# Patient Record
Sex: Female | Born: 1995 | Race: Black or African American | Hispanic: No | Marital: Single | Smoking: Never smoker
Health system: Southern US, Community
[De-identification: ages and names within clinical notes are randomized; demographics above are authoritative.]

## PROBLEM LIST (undated history)

## (undated) DIAGNOSIS — T7840XA Allergy, unspecified, initial encounter: Secondary | ICD-10-CM

## (undated) HISTORY — DX: Allergy, unspecified, initial encounter: T78.40XA

---
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
## 2015-01-20 ENCOUNTER — Telehealth: Payer: Self-pay | Admitting: Family Medicine

## 2015-01-21 NOTE — Telephone Encounter (Signed)
error 

## 2016-01-11 ENCOUNTER — Emergency Department (HOSPITAL_COMMUNITY)
Admission: EM | Admit: 2016-01-11 | Discharge: 2016-01-11 | Disposition: A | Discharge status: Home / Self Care | Payer: Medicaid Other | Attending: Emergency Medicine | Admitting: Emergency Medicine

## 2016-01-11 ENCOUNTER — Encounter (HOSPITAL_COMMUNITY): Payer: Self-pay | Admitting: Neurology

## 2016-01-11 DIAGNOSIS — R519 Headache, unspecified: Secondary | ICD-10-CM

## 2016-01-11 DIAGNOSIS — R51 Headache: Secondary | ICD-10-CM | POA: Insufficient documentation

## 2016-01-11 LAB — POC URINE PREG, ED: Preg Test, Ur: NEGATIVE

## 2016-01-11 MED ORDER — NAPROXEN 500 MG PO TABS: 500.0000 mg | ORAL_TABLET | Freq: Two times a day (BID) | ORAL | 0 refills | Status: AC

## 2016-01-11 NOTE — ED Notes (Signed)
Patient ambulated from waiting room to room. Pt tolerated well.

## 2016-01-11 NOTE — ED Provider Notes (Signed)
[facility]-EMERGENCY DEPT Provider Note   CSN: [id number] Arrival date & time: 01/11/16  1040     History   Chief Complaint Chief Complaint  Patient presents with  . Migraine    HPI Rebekah Murphy is a 20 y.o. female.  The history is provided by the patient.  Migraine  This is a recurrent problem. Episode onset: 3 days ago. The problem occurs every several days. The problem has been gradually worsening. Associated symptoms include headaches. Pertinent negatives include no chest pain and no abdominal pain. Nothing aggravates the symptoms. Nothing relieves the symptoms. Treatments tried: ibuprofen. The treatment provided no relief.    Past Medical History:  Diagnosis Date  . Allergy     There are no active problems to display for this patient.   History reviewed. No pertinent surgical history.  OB History    No data available       Home Medications    Prior to Admission medications   Medication Sig Start Date End Date Taking? Authorizing Provider  diphenhydrAMINE (BENADRYL) 25 MG tablet Take 25 mg by mouth every 6 (six) hours as needed.    Historical Provider, MD    Family History No family history on file.  Social History Social History  Substance Use Topics  . Smoking status: Never Smoker  . Smokeless tobacco: Not on file  . Alcohol use No     Allergies   Review of patient's allergies indicates no known allergies.   Review of Systems Review of Systems  Cardiovascular: Negative for chest pain.  Gastrointestinal: Negative for abdominal pain.  Neurological: Positive for headaches.  All other systems reviewed and are negative.    Physical Exam Updated Vital Signs BP 108/82 (BP Location: Left Arm)   Pulse 77   Temp 98 F (36.7 C) (Oral)   Resp 18   LMP 01/04/2016   SpO2 96%   Physical Exam  Constitutional: She is oriented to person, place, and time. She appears well-developed and well-nourished. No distress.  HENT:  Head: Normocephalic.  Eyes:  Conjunctivae are normal.  Neck: Neck supple. No tracheal deviation present.  Cardiovascular: Normal rate and regular rhythm.   Pulmonary/Chest: Effort normal. No respiratory distress.  Abdominal: Soft. She exhibits no distension.  Neurological: She is alert and oriented to person, place, and time. She has normal strength. No cranial nerve deficit. Coordination and gait normal. GCS eye subscore is 4. GCS verbal subscore is 5. GCS motor subscore is 6.  Normal finger to nose testing and rapid alternating movement   Skin: Skin is warm and dry.  Psychiatric: She has a normal mood and affect.     ED Treatments / Results  Labs (all labs ordered are listed, but only abnormal results are displayed) Labs Reviewed  POC URINE PREG, ED    EKG  EKG Interpretation None       Radiology No results found.  Procedures Procedures (including critical care time)  Medications Ordered in ED Medications - No data to display   Initial Impression / Assessment and Plan / ED Course  I have reviewed the triage vital signs and the nursing notes.  Pertinent labs & imaging results that were available during my care of the patient were reviewed by me and considered in my medical decision making (see chart for details).  Clinical Course    20 y.o. female presents with migraine headache starting 3 days ago. Not responding to supportive care measures at home. No neurologic deficits here and otherwise well appearing  despite discomfort. No red flag symptoms for headache, no signs or symptoms of spontaneous ICH. Declined IV therapy for symptoms.discussed optimal dosing of NSAIDs to help treat and prevent recurrence. Plan to follow up with PCP as needed and return precautions discussed for worsening or new concerning symptoms.   Final Clinical Impressions(s) / ED Diagnoses   Final diagnoses:  Acute nonintractable headache, unspecified headache type    New Prescriptions Discharge Medication List as of  01/11/2016  2:19 PM    START taking these medications   Details  naproxen (NAPROSYN) 500 MG tablet Take 1 tablet (500 mg total) by mouth 2 (two) times daily with a meal., Starting Wed 01/11/2016, Until Mon 01/16/2016, Print         Lyndal Pulleyaniel Vara Mairena, MD 01/11/16 1745

## 2016-01-11 NOTE — ED Triage Notes (Signed)
Pt here for h/a x 3 days. Denies vision problems, has photophobia. Is a x 4. In NAD. Has been having h/a for several months.

## 2018-06-11 ENCOUNTER — Encounter (HOSPITAL_BASED_OUTPATIENT_CLINIC_OR_DEPARTMENT_OTHER): Payer: Self-pay

## 2018-06-11 ENCOUNTER — Emergency Department (HOSPITAL_BASED_OUTPATIENT_CLINIC_OR_DEPARTMENT_OTHER)
Admission: EM | Admit: 2018-06-11 | Discharge: 2018-06-11 | Disposition: A | Discharge status: Home / Self Care | Payer: Self-pay | Attending: Emergency Medicine | Admitting: Emergency Medicine

## 2018-06-11 ENCOUNTER — Other Ambulatory Visit: Payer: Self-pay

## 2018-06-11 DIAGNOSIS — B349 Viral infection, unspecified: Secondary | ICD-10-CM | POA: Insufficient documentation

## 2018-06-11 MED ORDER — ONDANSETRON 4 MG PO TBDP: 4.0000 mg | ORAL_TABLET | Freq: Three times a day (TID) | ORAL | 0 refills | Status: DC | PRN

## 2018-06-11 MED ORDER — BENZONATATE 100 MG PO CAPS: 100.0000 mg | ORAL_CAPSULE | Freq: Three times a day (TID) | ORAL | 0 refills | Status: DC

## 2018-06-11 NOTE — Discharge Instructions (Signed)
Your symptoms are likely consistent with a viral illness. Viruses do not require or respond to antibiotics. Treatment is symptomatic care and it is important to note that these symptoms may last for 7-14 days.   Hand washing: Wash your hands throughout the day, but especially before and after touching the face, using the restroom, sneezing, coughing, or touching surfaces that have been coughed or sneezed upon. Hydration: Symptoms will be intensified and complicated by dehydration. Dehydration can also extend the duration of symptoms. Drink plenty of fluids and get plenty of rest. You should be drinking at least half a liter of water an hour to stay hydrated. Electrolyte drinks (ex. Gatorade, Powerade, Pedialyte) are also encouraged. You should be drinking enough fluids to make your urine light yellow, almost clear. If this is not the case, you are not drinking enough water. Please note that some of the treatments indicated below will not be effective if you are not adequately hydrated. Diet: Please concentrate on hydration, however, you may introduce food slowly.  Start with a clear liquid diet, progressed to a full liquid diet, and then bland solids as you are able. Pain or fever: Ibuprofen, Naproxen, or acetaminophen (generic for Tylenol) for pain or fever.  Nausea/vomiting: Use the ondansetron (generic for Zofran) for nausea or vomiting.  Cough: Use the benzonatate (generic for Tessalon) for cough.  Zyrtec or Claritin: May add these medication daily to control underlying symptoms of congestion, sneezing, and other signs of allergies.  These medications are available over-the-counter. Generics: Cetirizine (generic for Zyrtec) and loratadine (generic for Claritin). Fluticasone: Use fluticasone (generic for Flonase), as directed, for nasal and sinus congestion.  This medication is available over-the-counter. Congestion: Plain guaifenesin (generic for plain Mucinex) may help relieve congestion. Saline  sinus rinses and saline nasal sprays may also help relieve congestion. If you do not have high blood pressure, heart problems, or an allergy to such medications, you may also try phenylephrine or Sudafed. Sore throat: Warm liquids or Chloraseptic spray may help soothe a sore throat. Gargle twice a day with a salt water solution made from a half teaspoon of salt in a cup of warm water.  Follow up: Follow up with a primary care provider within the next two weeks should symptoms fail to resolve. Return: Return to the ED for significantly worsening symptoms, shortness of breath, persistent vomiting, large amounts of blood in stool, or any other major concerns.  For prescription assistance, may try using prescription discount sites or apps, such as goodrx.com

## 2018-06-11 NOTE — ED Triage Notes (Signed)
C/o flu like sx day 2-NAD-steady gait 

## 2018-06-11 NOTE — ED Provider Notes (Signed)
[facility] EMERGENCY DEPARTMENT Provider Note   CSN: [id number] Arrival date & time: 06/11/18  1903     History   Chief Complaint Chief Complaint  Patient presents with  . Cough    HPI Rebekah Murphy is a 23 y.o. female.  HPI   Rebekah Murphy is a 23 y.o. female, presenting to the ED with cough, body aches, nausea, and chills today.  One episode of vomiting yesterday. She has tried NyQuil.  Denies known fever, shortness of breath, abdominal pain, diarrhea, chest pain, rash, sore throat, or any other complaints.     Past Medical History:  Diagnosis Date  . Allergy     There are no active problems to display for this patient.   History reviewed. No pertinent surgical history.   OB History   No obstetric history on file.      Home Medications    Prior to Admission medications   Medication Sig Start Date End Date Taking? Authorizing Provider  benzonatate (TESSALON) 100 MG capsule Take 1 capsule (100 mg total) by mouth every 8 (eight) hours. 06/11/18   Boris Engelmann C, PA-C  ibuprofen (ADVIL,MOTRIN) 200 MG tablet Take 600 mg by mouth every 6 (six) hours as needed for headache or moderate pain.    [provider]  ondansetron (ZOFRAN ODT) 4 MG disintegrating tablet Take 1 tablet (4 mg total) by mouth every 8 (eight) hours as needed for nausea or vomiting. 06/11/18   Zuhayr Deeney, Hillard Danker, PA-C    Family History No family history on file.  Social History Social History   Tobacco Use  . Smoking status: Never Smoker  . Smokeless tobacco: Never Used  Substance Use Topics  . Alcohol use: No  . Drug use: No     Allergies   Patient has no known allergies.   Review of Systems Review of Systems  Constitutional: Positive for chills. Negative for fever.  HENT: Positive for rhinorrhea. Negative for sore throat and trouble swallowing.   Respiratory: Positive for cough. Negative for shortness of breath.   Cardiovascular: Negative for chest pain and leg  swelling.  Gastrointestinal: Positive for nausea and vomiting. Negative for abdominal pain and diarrhea.  Musculoskeletal: Negative for back pain, neck pain and neck stiffness.  Skin: Negative for rash.  Neurological: Negative for syncope and weakness.  All other systems reviewed and are negative.    Physical Exam Updated Vital Signs BP 122/80 (BP Location: Left Arm)   Pulse (!) 103   Temp 99.9 F (37.7 C) (Oral)   Resp 18   Ht 5\' 1"  (1.549 m)   Wt 82.1 kg   SpO2 100%   BMI 34.20 kg/m   Physical Exam Vitals signs and nursing note reviewed.  Constitutional:      General: She is not in acute distress.    Appearance: She is well-developed. She is not diaphoretic.  HENT:     Head: Normocephalic and atraumatic.     Nose: Rhinorrhea present.     Mouth/Throat:     Mouth: Mucous membranes are moist.     Pharynx: Oropharynx is clear.  Eyes:     Conjunctiva/sclera: Conjunctivae normal.  Neck:     Musculoskeletal: Neck supple.  Cardiovascular:     Rate and Rhythm: Normal rate and regular rhythm.     Pulses: Normal pulses.     Heart sounds: Normal heart sounds.  Pulmonary:     Effort: Pulmonary effort is normal. No respiratory distress.     Breath  sounds: Normal breath sounds.  Abdominal:     Palpations: Abdomen is soft.     Tenderness: There is no abdominal tenderness. There is no guarding.  Musculoskeletal:     Right lower leg: No edema.     Left lower leg: No edema.  Lymphadenopathy:     Cervical: No cervical adenopathy.  Skin:    General: Skin is warm and dry.  Neurological:     Mental Status: She is alert.  Psychiatric:        Mood and Affect: Mood and affect normal.        Speech: Speech normal.        Behavior: Behavior normal.      ED Treatments / Results  Labs (all labs ordered are listed, but only abnormal results are displayed) Labs Reviewed - No data to display  EKG None  Radiology No results found.  Procedures Procedures (including critical  care time)  Medications Ordered in ED Medications - No data to display   Initial Impression / Assessment and Plan / ED Course  I have reviewed the triage vital signs and the nursing notes.  Pertinent labs & imaging results that were available during my care of the patient were reviewed by me and considered in my medical decision making (see chart for details).     Patient presents with symptoms consistent with viral syndrome. Patient is nontoxic appearing, afebrile, not tachycardic on my exam, not tachypneic, not hypotensive, excellent SPO2 on room air, and is in no apparent distress.  The patient was given instructions for home care as well as return precautions. Patient voices understanding of these instructions, accepts the plan, and is comfortable with discharge.    Final Clinical Impressions(s) / ED Diagnoses   Final diagnoses:  Viral syndrome    ED Discharge Orders         Ordered    ondansetron (ZOFRAN ODT) 4 MG disintegrating tablet  Every 8 hours PRN     06/11/18 2012    benzonatate (TESSALON) 100 MG capsule  Every 8 hours     06/11/18 2012           Concepcion LivingJoy, Shevy Yaney C, PA-C 06/11/18 2013    Alvira MondaySchlossman, Erin, MD 06/13/18 1944

## 2020-03-01 ENCOUNTER — Other Ambulatory Visit: Payer: Self-pay

## 2020-03-01 ENCOUNTER — Emergency Department (HOSPITAL_BASED_OUTPATIENT_CLINIC_OR_DEPARTMENT_OTHER)
Admission: EM | Admit: 2020-03-01 | Discharge: 2020-03-01 | Disposition: A | Discharge status: Home / Self Care | Payer: HRSA Program | Attending: Emergency Medicine | Admitting: Emergency Medicine

## 2020-03-01 ENCOUNTER — Encounter (HOSPITAL_BASED_OUTPATIENT_CLINIC_OR_DEPARTMENT_OTHER): Payer: Self-pay | Admitting: *Deleted

## 2020-03-01 ENCOUNTER — Emergency Department (HOSPITAL_BASED_OUTPATIENT_CLINIC_OR_DEPARTMENT_OTHER): Payer: HRSA Program

## 2020-03-01 DIAGNOSIS — R6883 Chills (without fever): Secondary | ICD-10-CM | POA: Diagnosis present

## 2020-03-01 DIAGNOSIS — U071 COVID-19: Secondary | ICD-10-CM | POA: Diagnosis not present

## 2020-03-01 LAB — RESPIRATORY PANEL BY RT PCR (FLU A&B, COVID)
Influenza A by PCR: NEGATIVE
Influenza B by PCR: NEGATIVE
SARS Coronavirus 2 by RT PCR: POSITIVE — AB

## 2020-03-01 NOTE — Discharge Instructions (Signed)
You have tested positive for COVID-19.  Your chest x-ray was reassuring.  Given that your shortness of breath symptoms improved with TheraFlu, please continue with that as needed.  I also encourage you to check your temperature regularly and take Tylenol or ibuprofen as needed for fever control.  Do not take ibuprofen if you are pregnant or breast-feeding.  I would like for you to obtain a pulse oximeter over-the-counter at a pharmacy.  We want your oxygen to stay in the 90s.  Please follow-up with your OB/GYN/PCP virtually regarding today's encounter.  Please maintain isolation precautions and read the attachment on ways to manage your COVID-19 symptoms.  Please return to the ED or seek immediate medical attention should you experience any new or worsening symptoms.

## 2020-03-01 NOTE — ED Notes (Signed)
Date and time results received: 03/01/20 10:25 PM  (use smartphrase ".now" to insert current time)  Test: Covid Critical Value: Positive  Name of Provider Notified: Glenna Fellows PA  Orders Received? Or Actions Taken?: No new orders

## 2020-03-01 NOTE — ED Triage Notes (Signed)
Cough, chills started 4 days ago. Positive home covid test today. Reports SOB that increased today.

## 2020-03-01 NOTE — ED Provider Notes (Signed)
[facility] EMERGENCY DEPARTMENT Provider Note   CSN: [id number] Arrival date & time: 03/01/20  2105     History Chief Complaint  Patient presents with  . Covid Positive    Rebekah Murphy is a 24 y.o. female with no relevant past medical history presents the ED with 4-day history of chills and feeling as though she cannot take a deep breath.  She tested positive for COVID-19 at home today.  She did not want to come in, but was advised by her girlfriend to come in for evaluation.  She denies any chest pain, fevers, abdominal pain, nausea or vomiting, pleuritic symptoms, history of clots or clotting disorder, OCP use, diminished appetite, or other symptoms.  Patient reports that she was able to eat steak and potatoes today.  She states that she has been taking TheraFlu over-the-counter which has improved her shortness of breath symptoms.  Patient did not receive the COVID-19 immunization.  HPI     Past Medical History:  Diagnosis Date  . Allergy     There are no problems to display for this patient.   History reviewed. No pertinent surgical history.   OB History   No obstetric history on file.     History reviewed. No pertinent family history.  Social History   Tobacco Use  . Smoking status: Never Smoker  . Smokeless tobacco: Never Used  Vaping Use  . Vaping Use: Never used  Substance Use Topics  . Alcohol use: No  . Drug use: No    Home Medications Prior to Admission medications   Medication Sig Start Date End Date Taking? Authorizing Provider  benzonatate (TESSALON) 100 MG capsule Take 1 capsule (100 mg total) by mouth every 8 (eight) hours. 06/11/18   Joy, Shawn C, PA-C  ibuprofen (ADVIL,MOTRIN) 200 MG tablet Take 600 mg by mouth every 6 (six) hours as needed for headache or moderate pain.    [provider]  ondansetron (ZOFRAN ODT) 4 MG disintegrating tablet Take 1 tablet (4 mg total) by mouth every 8 (eight) hours as needed for nausea or  vomiting. 06/11/18   Joy, Shawn C, PA-C    Allergies    Patient has no known allergies.  Review of Systems   Review of Systems  All other systems reviewed and are negative.   Physical Exam Updated Vital Signs BP 119/60 (BP Location: Left Arm)   Pulse (!) 111   Temp 99.5 F (37.5 C) (Oral)   Resp 18   Ht 5\' 2"  (1.575 m)   Wt 81.6 kg   LMP 02/16/2020   SpO2 100%   BMI 32.92 kg/m   Physical Exam Vitals and nursing note reviewed. Exam conducted with a chaperone present.  Constitutional:      General: She is not in acute distress.    Appearance: Normal appearance. She is obese.  HENT:     Head: Normocephalic and atraumatic.  Eyes:     General: No scleral icterus.    Conjunctiva/sclera: Conjunctivae normal.  Cardiovascular:     Rate and Rhythm: Normal rate and regular rhythm.     Pulses: Normal pulses.     Heart sounds: Normal heart sounds.     Comments: While patient was tachycardic during EKG, her heart rate was much more reassuring 90 on my examination. Pulmonary:     Comments: No increased work of breathing.  Speaks in full sentences without taking a break.  No tachypnea.  Oxygenating at 100% on RA.  No rales or  other abnormal breath sounds. Abdominal:     General: Abdomen is flat. There is no distension.     Palpations: Abdomen is soft.     Tenderness: There is no abdominal tenderness.  Musculoskeletal:        General: Normal range of motion.     Cervical back: Normal range of motion and neck supple. No rigidity.     Right lower leg: No edema.     Left lower leg: No edema.  Skin:    General: Skin is dry.     Capillary Refill: Capillary refill takes less than 2 seconds.  Neurological:     Mental Status: She is alert and oriented to person, place, and time.     GCS: GCS eye subscore is 4. GCS verbal subscore is 5. GCS motor subscore is 6.  Psychiatric:        Mood and Affect: Mood normal.        Behavior: Behavior normal.        Thought Content: Thought content  normal.      ED Results / Procedures / Treatments   Labs (all labs ordered are listed, but only abnormal results are displayed) Labs Reviewed  RESPIRATORY PANEL BY RT PCR (FLU A&B, COVID) - Abnormal; Notable for the following components:      Result Value   SARS Coronavirus 2 by RT PCR POSITIVE (*)    All other components within normal limits    EKG None  Radiology DG Chest Portable 1 View  Result Date: 03/01/2020 CLINICAL DATA:  COVID pneumonia EXAM: PORTABLE CHEST 1 VIEW COMPARISON:  None. FINDINGS: The heart size and mediastinal contours are within normal limits. Both lungs are clear. The visualized skeletal structures are unremarkable. IMPRESSION: No active disease. Electronically Signed   By: Helyn Numbers MD   On: 03/01/2020 21:44    Procedures Procedures (including critical care time)  Medications Ordered in ED Medications - No data to display  ED Course  I have reviewed the triage vital signs and the nursing notes.  Pertinent labs & imaging results that were available during my care of the patient were reviewed by me and considered in my medical decision making (see chart for details).    MDM Rules/Calculators/A&P                          Patient is positive for COVID-19.  Given patient's brevity of illness and the fact that she is eating and drinking well without any reported nausea or vomiting, do not feel as though laboratory work-up is warranted.  Instead, CXR was obtained given her reported shortness of breath which demonstrated no focal infiltrates or active cardiopulmonary disease.  Patient is PERC negative (not tachycardic on my exam) and without complaints of chest pain.  Her physical exam is reassuring I do not feel as though CTA PE study is warranted.   She is young, otherwise healthy.  Given reassuring vital signs and physical exam, reasonable for discharge at this time.  I reached out to the [facility] infusion center as she is inside the 10-day window and  has a BMI greater than 25.  She would be a good candidate for MAB infusions given that she has not been immunized.    Encouraging over-the-counter medications as needed for symptomatic relief.  She states that her symptoms of shortness of breath are improved with TheraFlu, I encouraged her to continue with that medication as needed.  I also  encouraged her to obtain a pulse oximeter over-the-counter at pharmacy to track your oxygenation.  Discussed very strict ED return precautions.  Patient voices understanding and is agreeable to the plan.  Rebekah Murphy was evaluated in Emergency Department on 03/01/2020 for the symptoms described in the history of present illness. She was evaluated in the context of the global COVID-19 pandemic, which necessitated consideration that the patient might be at risk for infection with the SARS-CoV-2 virus that causes COVID-19. Institutional protocols and algorithms that pertain to the evaluation of patients at risk for COVID-19 are in a state of rapid change based on information released by regulatory bodies including the CDC and federal and state organizations. These policies and algorithms were followed during the patient's care in the ED.   Final Clinical Impression(s) / ED Diagnoses Final diagnoses:  COVID-19    Rx / DC Orders ED Discharge Orders    None       Lorelee New, PA-C 03/01/20 2253    Virgina Norfolk, DO 03/01/20 2339

## 2020-03-02 ENCOUNTER — Telehealth: Payer: Self-pay | Admitting: Family

## 2020-03-02 ENCOUNTER — Telehealth: Payer: Self-pay | Admitting: Internal Medicine

## 2020-03-02 NOTE — Telephone Encounter (Signed)
Called to Discuss with patient about Covid symptoms and the use of the monoclonal antibody infusion for those with mild to moderate Covid symptoms and at a high risk of hospitalization.     Pt appears to qualify for this infusion due to co-morbid conditions and/or a member of an at-risk group in accordance with the FDA Emergency Use Authorization. Pt with BMI 32. Symptom onset 10/7. Last day eligible for infusion would be 10/16.    Pt would like some time to consider. She is aware of 10 day window. [facility]AB infusion hotline number given to call should she wish to proceed.   Cyndee Brightly, NP [facility]

## 2020-03-02 NOTE — Telephone Encounter (Signed)
Error

## 2020-08-10 ENCOUNTER — Encounter (HOSPITAL_BASED_OUTPATIENT_CLINIC_OR_DEPARTMENT_OTHER): Payer: Self-pay | Admitting: Emergency Medicine

## 2020-08-10 ENCOUNTER — Emergency Department (HOSPITAL_BASED_OUTPATIENT_CLINIC_OR_DEPARTMENT_OTHER)
Admission: EM | Admit: 2020-08-10 | Discharge: 2020-08-10 | Disposition: A | Discharge status: Home / Self Care | Payer: Self-pay | Attending: Emergency Medicine | Admitting: Emergency Medicine

## 2020-08-10 ENCOUNTER — Emergency Department (HOSPITAL_BASED_OUTPATIENT_CLINIC_OR_DEPARTMENT_OTHER): Payer: Self-pay

## 2020-08-10 ENCOUNTER — Other Ambulatory Visit: Payer: Self-pay

## 2020-08-10 DIAGNOSIS — W230XXA Caught, crushed, jammed, or pinched between moving objects, initial encounter: Secondary | ICD-10-CM | POA: Insufficient documentation

## 2020-08-10 DIAGNOSIS — S6010XA Contusion of unspecified finger with damage to nail, initial encounter: Secondary | ICD-10-CM

## 2020-08-10 DIAGNOSIS — S5011XA Contusion of right forearm, initial encounter: Secondary | ICD-10-CM | POA: Insufficient documentation

## 2020-08-10 DIAGNOSIS — S60111A Contusion of right thumb with damage to nail, initial encounter: Secondary | ICD-10-CM | POA: Insufficient documentation

## 2020-08-10 NOTE — ED Provider Notes (Signed)
[facility] EMERGENCY DEPARTMENT Provider Note   CSN: [id number] Arrival date & time: 08/10/20  2044     History Chief Complaint  Patient presents with  . Finger Injury    Rebekah Murphy is a 25 y.o. female presenting for evaluation of right thumb. She slammed her right thumb in her car door yesterday. She has developed bleeding under her nail and has pain to the DIP joint with movement.  Treated with ibuprofen.  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  The history is provided by the patient.       Past Medical History:  Diagnosis Date  . Allergy     There are no problems to display for this patient.   History reviewed. No pertinent surgical history.   OB History   No obstetric history on file.     No family history on file.  Social History   Tobacco Use  . Smoking status: Never Smoker  . Smokeless tobacco: Never Used  Vaping Use  . Vaping Use: Never used  Substance Use Topics  . Alcohol use: No  . Drug use: No    Home Medications Prior to Admission medications   Not on File    Allergies    Patient has no known allergies.  Review of Systems   Review of Systems  Musculoskeletal: Positive for arthralgias.  Skin: Positive for color change.    Physical Exam Updated Vital Signs BP 125/77   Pulse 70   Temp 98.5 F (36.9 C) (Oral)   Resp 16   Ht 5\' 2"  (1.575 m)   Wt 86.2 kg   SpO2 100%   BMI 34.75 kg/m   Physical Exam Vitals and nursing note reviewed.  Constitutional:      Appearance: She is well-developed.  HENT:     Head: Normocephalic and atraumatic.  Eyes:     Conjunctiva/sclera: Conjunctivae normal.  Cardiovascular:     Rate and Rhythm: Normal rate.  Pulmonary:     Effort: Pulmonary effort is normal.  Musculoskeletal:     Comments: Right dorsal distal forearm with mild bruising and swelling noted.  There is small proximal  subungual hematoma noted.  Patient has pain ranging the DIP though no deformity is noted.  Neurovascularly intact.  No open wounds.  Neurological:     Mental Status: She is alert.  Psychiatric:        Mood and Affect: Mood normal.        Behavior: Behavior normal.     ED Results / Procedures / Treatments   Labs (all labs ordered are listed, but only abnormal results are displayed) Labs Reviewed - No data to display  EKG None  Radiology DG Finger Thumb Right  Result Date: 08/10/2020 CLINICAL DATA:  Crush injury right thumb yesterday EXAM: RIGHT THUMB 2+V COMPARISON:  None. FINDINGS: Frontal, oblique, and lateral views of the right thumb are obtained. No fracture, subluxation, or dislocation. Joint spaces are well preserved. Soft tissues are unremarkable. IMPRESSION: 1. Unremarkable right thumb. Electronically Signed   By: 08/12/2020 M.D.   On: 08/10/2020 21:16    Procedures Trephination  Date/Time: 08/10/2020 11:36 PM Performed by: Delana Manganello, 08/12/2020 N, PA-C Authorized by: Jeanni Allshouse, [place] N, PA-C  Consent: Verbal consent obtained. Risks and benefits: risks, benefits and alternatives were discussed Consent given by: patient Required items: required blood products, implants, devices, and  special equipment available Patient identity confirmed: verbally with patient Preparation: Patient was prepped and draped in the usual sterile fashion. Local anesthesia used: no  Anesthesia: Local anesthesia used: no  Sedation: Patient sedated: no  Patient tolerance: patient tolerated the procedure well with no immediate complications Comments: Nail was cleansed with alcohol swab.  Low temperature Bovie cautery tip was used to create small bur hole in the center of the subungual hematoma.  Dark red blood drained and patient reported improvement in symptoms.  Bandage placed.  Patient tolerated well      Medications Ordered in ED Medications - No data to display  ED Course  I have  reviewed the triage vital signs and the nursing notes.  Pertinent labs & imaging results that were available during my care of the patient were reviewed by me and considered in my medical decision making (see chart for details).    MDM Rules/Calculators/A&P                          Patient presenting with bruising and subungual hematoma to the right thumb after closing her thumb in the car door yesterday.  X-ray is independently reviewed and interpreted by myself, appears negative.  Radiologist read is negative.  Trephination performed for subungual hematoma with improvement in symptoms.  Recommend symptomatic management, NSAIDs, ice, elevation.  Discussed results, findings, treatment and follow up. Patient advised of return precautions. Patient verbalized understanding and agreed with plan.  Final Clinical Impression(s) / ED Diagnoses Final diagnoses:  Subungual hematoma of digit of hand, initial encounter    Rx / DC Orders ED Discharge Orders    None       Orlan Aversa, [place] N, PA-C 08/10/20 2338    Cheryll Cockayne, MD 08/12/20 [phone]

## 2020-08-10 NOTE — ED Triage Notes (Signed)
Pt reports shutting right thumb in door yesterday. Pt has bruising to base of nail

## 2021-05-12 ENCOUNTER — Other Ambulatory Visit: Payer: Self-pay

## 2021-05-12 ENCOUNTER — Encounter (HOSPITAL_BASED_OUTPATIENT_CLINIC_OR_DEPARTMENT_OTHER): Payer: Self-pay | Admitting: *Deleted

## 2021-05-12 ENCOUNTER — Emergency Department (HOSPITAL_BASED_OUTPATIENT_CLINIC_OR_DEPARTMENT_OTHER)
Admission: EM | Admit: 2021-05-12 | Discharge: 2021-05-12 | Disposition: A | Discharge status: Home / Self Care | Payer: No Typology Code available for payment source | Attending: Emergency Medicine | Admitting: Emergency Medicine

## 2021-05-12 DIAGNOSIS — M25512 Pain in left shoulder: Secondary | ICD-10-CM | POA: Insufficient documentation

## 2021-05-12 DIAGNOSIS — Y92410 Unspecified street and highway as the place of occurrence of the external cause: Secondary | ICD-10-CM | POA: Diagnosis not present

## 2021-05-12 DIAGNOSIS — M545 Low back pain, unspecified: Secondary | ICD-10-CM | POA: Insufficient documentation

## 2021-05-12 MED ORDER — METHOCARBAMOL 500 MG PO TABS: 500.0000 mg | ORAL_TABLET | Freq: Two times a day (BID) | ORAL | 0 refills | Status: AC

## 2021-05-12 MED ORDER — LIDOCAINE 5 % EX PTCH: 1.0000 | MEDICATED_PATCH | CUTANEOUS | 0 refills | Status: AC
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
## 2022-02-23 IMAGING — DX DG FINGER THUMB 2+V*R*
3 series · 3 of 3 positions shown · non-contrast
Comparison: None.

CLINICAL DATA: Crush injury right thumb yesterday

EXAM:
RIGHT THUMB 2+V

[finger ap]
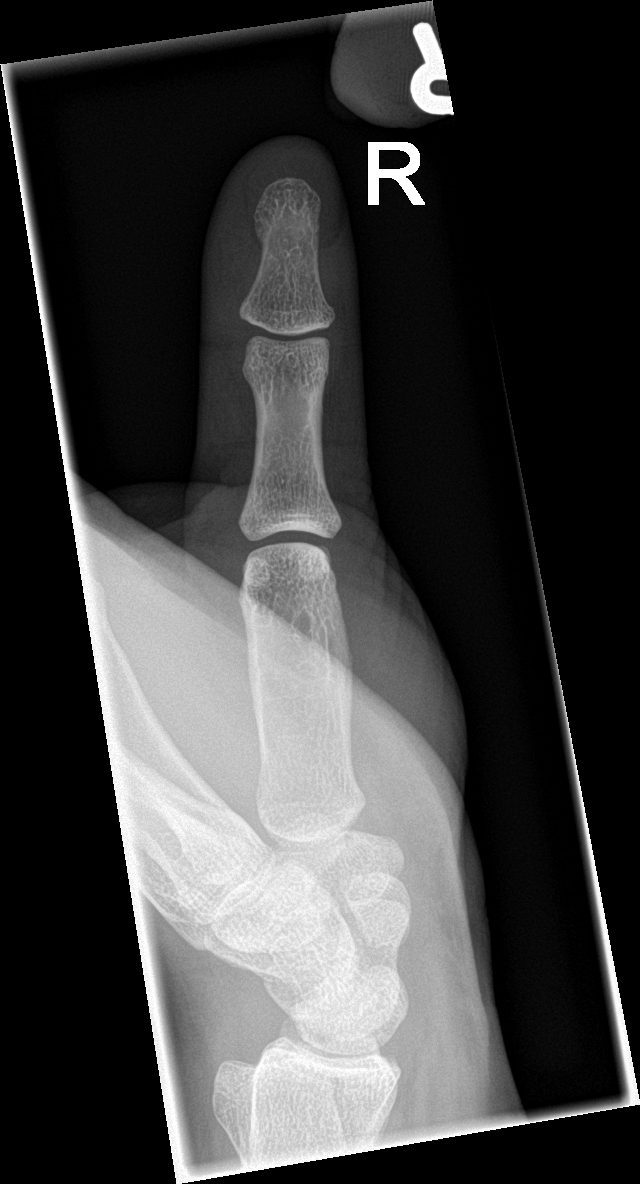

[finger obl]
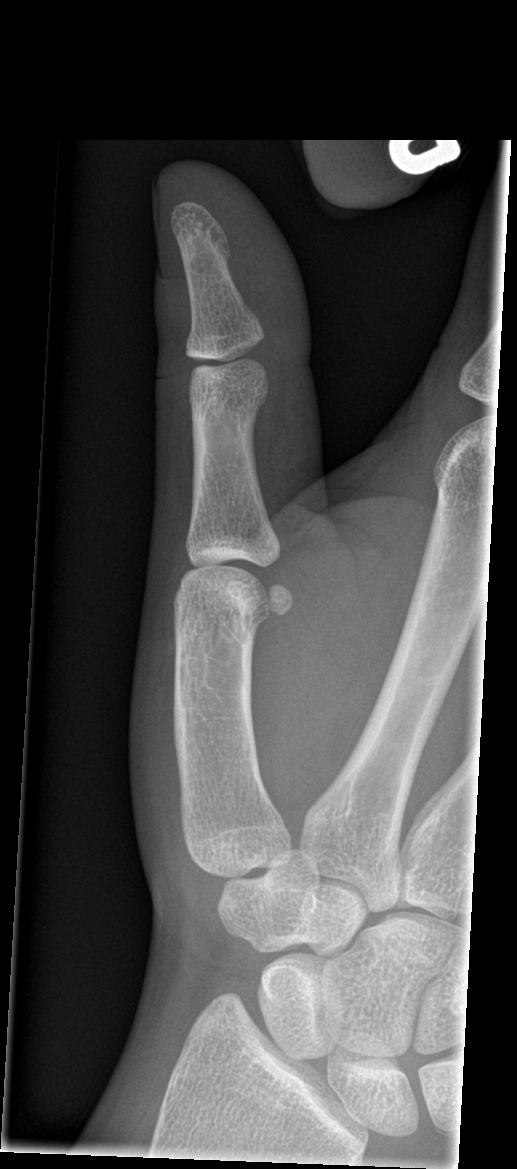

[finger lat]
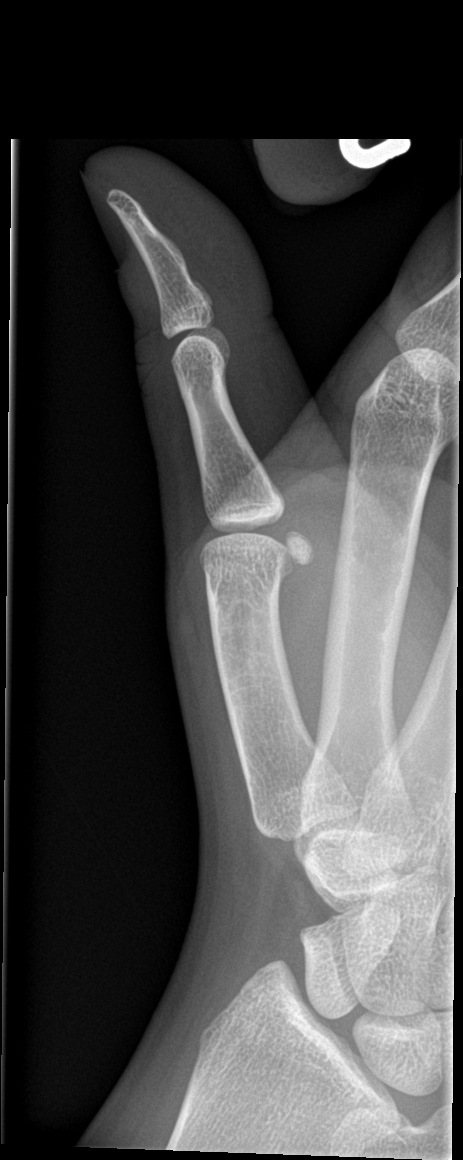

[3 of 3 positions shown; findings below may reference images not displayed]

FINDINGS: Frontal, oblique, and lateral views of the right thumb are obtained.
No fracture, subluxation, or dislocation. Joint spaces are well
preserved. Soft tissues are unremarkable.
IMPRESSION: 1. Unremarkable right thumb.

## 2023-07-23 ENCOUNTER — Other Ambulatory Visit: Payer: Self-pay

## 2023-07-23 ENCOUNTER — Emergency Department (HOSPITAL_BASED_OUTPATIENT_CLINIC_OR_DEPARTMENT_OTHER)

## 2023-07-23 ENCOUNTER — Encounter (HOSPITAL_BASED_OUTPATIENT_CLINIC_OR_DEPARTMENT_OTHER): Payer: Self-pay | Admitting: Emergency Medicine

## 2023-07-23 ENCOUNTER — Emergency Department (HOSPITAL_BASED_OUTPATIENT_CLINIC_OR_DEPARTMENT_OTHER)
Admission: EM | Admit: 2023-07-23 | Discharge: 2023-07-23 | Disposition: A | Discharge status: Home / Self Care | Attending: Emergency Medicine | Admitting: Emergency Medicine

## 2023-07-23 DIAGNOSIS — R519 Headache, unspecified: Secondary | ICD-10-CM | POA: Diagnosis not present

## 2023-07-23 DIAGNOSIS — R0602 Shortness of breath: Secondary | ICD-10-CM | POA: Diagnosis not present

## 2023-07-23 DIAGNOSIS — R6883 Chills (without fever): Secondary | ICD-10-CM | POA: Insufficient documentation

## 2023-07-23 DIAGNOSIS — R002 Palpitations: Secondary | ICD-10-CM | POA: Insufficient documentation

## 2023-07-23 DIAGNOSIS — R059 Cough, unspecified: Secondary | ICD-10-CM | POA: Diagnosis present

## 2023-07-23 DIAGNOSIS — M7918 Myalgia, other site: Secondary | ICD-10-CM | POA: Insufficient documentation

## 2023-07-23 LAB — RESP PANEL BY RT-PCR (RSV, FLU A&B, COVID)  RVPGX2
Influenza A by PCR: NEGATIVE
Influenza B by PCR: NEGATIVE
Resp Syncytial Virus by PCR: NEGATIVE
SARS Coronavirus 2 by RT PCR: NEGATIVE

## 2023-07-23 MED ORDER — CETIRIZINE HCL 10 MG PO TABS: 10.0000 mg | ORAL_TABLET | Freq: Every day | ORAL | 0 refills | Status: AC

## 2023-07-23 MED ORDER — ALBUTEROL SULFATE HFA 108 (90 BASE) MCG/ACT IN AERS: 2.0000 | INHALATION_SPRAY | RESPIRATORY_TRACT | Status: DC | PRN

## 2023-07-23 NOTE — Discharge Instructions (Signed)
 Evaluation was overall reassuring.  I agree with you that it could be related to allergies possibly to mold exposure.  Recommend he follow-up PCP.  The meantime recommend that you trial Zyrtec to see if your symptoms improve.  If you do have worsening chest pain, shortness of breath, develop cough and fever or any other concerning symptom please return emerged part further evaluation.

## 2023-07-23 NOTE — ED Triage Notes (Signed)
 Pt POV steady gait- C/o ShOB, chills, generalized body aches, headaches, palpitations x5 days. Reports sx only occur while she's in her apt.

## 2023-07-23 NOTE — ED Notes (Signed)
 Discharge instructions reviewed with patient. Patient verbalizes understanding, no further questions at this time. Medications/prescriptions and follow up information provided. No acute distress noted at time of departure.

## 2023-07-23 NOTE — ED Provider Notes (Signed)
 [place] EMERGENCY DEPARTMENT AT [facility] Provider Note   CSN: [id number] Arrival date & time: 07/23/23  1150     History  Chief Complaint  Patient presents with   Shortness of Breath   HPI Rebekah Murphy is a 28 y.o. female with history of allergies presenting for shortness of breath, cough, chills and generalized body ache and headaches and palpitations.  Started 5 days ago.  She attributes her symptoms to concern for mold in her apartment.  She stated that last night she spent the night at her grandmother's house and her symptoms having improved considerably.  At this time she is not short of breath, no chest pain and no palpitations.  States she has a cough that is intermittent and nonproductive.  Denies fever.  She states she is here because she "just wanted to get checked out".   Shortness of Breath      Home Medications Prior to Admission medications   Medication Sig Start Date End Date Taking? Authorizing Provider  cetirizine (ZYRTEC ALLERGY) 10 MG tablet Take 1 tablet (10 mg total) by mouth daily. 07/23/23  Yes Gareth Eagle, PA-C  lidocaine (LIDODERM) 5 % Place 1 patch onto the skin daily. Remove & Discard patch within 12 hours or as directed by MD 05/12/21   Redwine, Madison A, PA-C  methocarbamol (ROBAXIN) 500 MG tablet Take 1 tablet (500 mg total) by mouth 2 (two) times daily. 05/12/21   Redwine, Madison A, PA-C      Allergies    Strawberry (diagnostic)    Review of Systems   Review of Systems  Respiratory:  Positive for shortness of breath.     Physical Exam Updated Vital Signs BP 134/83   Pulse 92   Temp (!) 97 F (36.1 C)   Resp 16   Ht 5\' 1"  (1.549 m)   Wt 97.5 kg   LMP 07/16/2023 (Approximate)   SpO2 100%   BMI 40.62 kg/m  Physical Exam Vitals and nursing note reviewed.  HENT:     Head: Normocephalic and atraumatic.     Mouth/Throat:     Mouth: Mucous membranes are moist.  Eyes:     General:        Right eye: No discharge.         Left eye: No discharge.     Conjunctiva/sclera: Conjunctivae normal.  Cardiovascular:     Rate and Rhythm: Normal rate and regular rhythm.     Pulses: Normal pulses.     Heart sounds: Normal heart sounds.  Pulmonary:     Effort: Pulmonary effort is normal.     Breath sounds: Normal breath sounds.  Abdominal:     General: Abdomen is flat.     Palpations: Abdomen is soft.  Skin:    General: Skin is warm and dry.  Neurological:     General: No focal deficit present.  Psychiatric:        Mood and Affect: Mood normal.     ED Results / Procedures / Treatments   Labs (all labs ordered are listed, but only abnormal results are displayed) Labs Reviewed  RESP PANEL BY RT-PCR (RSV, FLU A&B, COVID)  [id number]    EKG None  Radiology DG Chest 2 View Result Date: 07/23/2023 CLINICAL DATA:  Shortness of breath and palpitations. EXAM: CHEST - 2 VIEW COMPARISON:  One-view portable chest x-ray 03/01/2020. FINDINGS: The heart size and mediastinal contours are within normal limits. No consolidation, pneumothorax or effusion. No edema. The  visualized skeletal structures are unremarkable. Slight curvature of the spine. IMPRESSION: No acute cardiopulmonary disease. Electronically Signed   By: Karen Kays M.D.   On: 07/23/2023 15:35    Procedures Procedures    Medications Ordered in ED Medications  albuterol (VENTOLIN HFA) 108 (90 Base) MCG/ACT inhaler 2 puff (has no administration in time range)    ED Course/ Medical Decision Making/ A&P                                 Medical Decision Making Amount and/or Complexity of Data Reviewed Radiology: ordered.  Risk Prescription drug management.   28 year old well-appearing female presenting for shortness of breath, cough and generalized bodyaches.  Exam was unremarkable.  DDx includes flu, pneumonia, mold exposure, other.  Respiratory PCR was negative.  I personally reviewed and interpreted x-ray which revealed no acute derangements.  On  reassessment patient remained without shortness of breath and chest pain.  Suspect this could be related to possible allergies especially given that her roommate is having similar symptoms.  Advised her to follow-up with PCP also recommended Zyrtec.  Discussed pertinent return precautions.  Discharged good condition.        Final Clinical Impression(s) / ED Diagnoses Final diagnoses:  Cough, unspecified type    Rx / DC Orders ED Discharge Orders          Ordered    cetirizine (ZYRTEC ALLERGY) 10 MG tablet  Daily        07/23/23 1548              Gareth Eagle, PA-C 07/23/23 1549    Pricilla Loveless, MD 07/25/23 1025
